# Patient Record
Sex: Male | Born: 2000 | Race: White | Hispanic: No | Marital: Single | Smoking: Never smoker
Health system: Southern US, Community
[De-identification: ages and names within clinical notes are randomized; demographics above are authoritative.]

## PROBLEM LIST (undated history)

## (undated) HISTORY — PX: NO PAST SURGERIES: SHX2092

---
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
## 2019-12-08 ENCOUNTER — Ambulatory Visit: Payer: Self-pay | Attending: Internal Medicine

## 2019-12-08 DIAGNOSIS — Z23 Encounter for immunization: Secondary | ICD-10-CM

## 2019-12-08 NOTE — Progress Notes (Signed)
   Covid-19 Vaccination Clinic  Name:  Victor Proctor    MRN: [id number] DOB: 2000/11/21  12/08/2019  Mr. Victor Proctor was observed post Covid-19 immunization for 15 minutes without incident. He was provided with Vaccine Information Sheet and instruction to access the V-Safe system.   Mr. Victor Proctor was instructed to call 911 with any severe reactions post vaccine: Marland Kitchen Difficulty breathing  . Swelling of face and throat  . A fast heartbeat  . A bad rash all over body  . Dizziness and weakness   Immunizations Administered    Name Date Dose VIS Date Route   Pfizer COVID-19 Vaccine 12/08/2019  8:43 AM 0.3 mL 08/11/2019 Intramuscular   Manufacturer: [employer], [employer]   Lot: [id number]   NDC: [address]-3

## 2020-01-02 ENCOUNTER — Ambulatory Visit: Payer: Self-pay | Attending: Internal Medicine

## 2020-01-02 DIAGNOSIS — Z23 Encounter for immunization: Secondary | ICD-10-CM

## 2020-01-02 NOTE — Progress Notes (Signed)
   Covid-19 Vaccination Clinic  Name:  DAHIR AYER    MRN: [id number] DOB: [birth year]  01/02/2020  Mr. Davtyan was observed post Covid-19 immunization for 15 minutes without incident. He was provided with Vaccine Information Sheet and instruction to access the V-Safe system.   Mr. Westbrooks was instructed to call 911 with any severe reactions post vaccine: Marland Kitchen Difficulty breathing  . Swelling of face and throat  . A fast heartbeat  . A bad rash all over body  . Dizziness and weakness   Immunizations Administered    Name Date Dose VIS Date Route   Pfizer COVID-19 Vaccine 01/02/2020  4:04 PM 0.3 mL 10/25/2018 Intramuscular   Manufacturer: [employer], [employer]   Lot: [id number]   NDC: [address]-8
